# Patient Record
Sex: Female | Born: 1945 | Race: White | Hispanic: No | Marital: Single | Smoking: Never smoker
Health system: Southern US, Community
[De-identification: ages and names within clinical notes are randomized; demographics above are authoritative.]

## PROBLEM LIST (undated history)

## (undated) DIAGNOSIS — I10 Essential (primary) hypertension: Secondary | ICD-10-CM

## (undated) HISTORY — PX: ABDOMINAL HYSTERECTOMY: SHX81

---

## 2018-03-04 ENCOUNTER — Other Ambulatory Visit: Payer: Self-pay

## 2018-03-04 ENCOUNTER — Encounter: Payer: Self-pay | Admitting: Emergency Medicine

## 2018-03-04 ENCOUNTER — Emergency Department
Admission: EM | Admit: 2018-03-04 | Discharge: 2018-03-04 | Disposition: A | Discharge status: Home / Self Care | Payer: Medicare Other | Attending: Emergency Medicine | Admitting: Emergency Medicine

## 2018-03-04 ENCOUNTER — Emergency Department: Payer: Medicare Other

## 2018-03-04 DIAGNOSIS — Y999 Unspecified external cause status: Secondary | ICD-10-CM | POA: Insufficient documentation

## 2018-03-04 DIAGNOSIS — S8991XA Unspecified injury of right lower leg, initial encounter: Secondary | ICD-10-CM | POA: Insufficient documentation

## 2018-03-04 DIAGNOSIS — S6991XA Unspecified injury of right wrist, hand and finger(s), initial encounter: Secondary | ICD-10-CM | POA: Insufficient documentation

## 2018-03-04 DIAGNOSIS — Y9389 Activity, other specified: Secondary | ICD-10-CM | POA: Insufficient documentation

## 2018-03-04 DIAGNOSIS — R0789 Other chest pain: Secondary | ICD-10-CM | POA: Insufficient documentation

## 2018-03-04 DIAGNOSIS — Y92410 Unspecified street and highway as the place of occurrence of the external cause: Secondary | ICD-10-CM | POA: Insufficient documentation

## 2018-03-04 DIAGNOSIS — S8001XA Contusion of right knee, initial encounter: Secondary | ICD-10-CM | POA: Diagnosis not present

## 2018-03-04 DIAGNOSIS — I10 Essential (primary) hypertension: Secondary | ICD-10-CM | POA: Insufficient documentation

## 2018-03-04 DIAGNOSIS — S8992XA Unspecified injury of left lower leg, initial encounter: Secondary | ICD-10-CM | POA: Diagnosis not present

## 2018-03-04 DIAGNOSIS — S299XXA Unspecified injury of thorax, initial encounter: Secondary | ICD-10-CM | POA: Diagnosis present

## 2018-03-04 DIAGNOSIS — T148XXA Other injury of unspecified body region, initial encounter: Secondary | ICD-10-CM

## 2018-03-04 HISTORY — DX: Essential (primary) hypertension: I10

## 2018-03-04 MED ORDER — CYCLOBENZAPRINE HCL 10 MG PO TABS
10.0000 mg | ORAL_TABLET | Freq: Once | ORAL | Status: AC
  Administered 2018-03-04: 10 mg via ORAL

## 2018-03-04 MED ORDER — HYDROCODONE-ACETAMINOPHEN 5-325 MG PO TABS
1.0000 | ORAL_TABLET | Freq: Once | ORAL | Status: AC
  Administered 2018-03-04: 1 via ORAL
  Filled 2018-03-04: qty 1

## 2018-03-04 MED ORDER — HYDROCODONE-ACETAMINOPHEN 5-325 MG PO TABS: 1.0000 | ORAL_TABLET | Freq: Four times a day (QID) | ORAL | 0 refills | Status: AC | PRN

## 2018-03-04 MED ORDER — IBUPROFEN 100 MG/5ML PO SUSP
800.0000 mg | Freq: Once | ORAL | Status: DC
  Filled 2018-03-04: qty 40

## 2018-03-04 MED ORDER — IBUPROFEN 800 MG PO TABS: 800.0000 mg | ORAL_TABLET | Freq: Three times a day (TID) | ORAL | 0 refills | Status: AC | PRN

## 2018-03-04 MED ORDER — DOCUSATE SODIUM 100 MG PO CAPS
100.0000 mg | ORAL_CAPSULE | Freq: Once | ORAL | Status: AC
  Administered 2018-03-04: 100 mg via ORAL
  Filled 2018-03-04: qty 1

## 2018-03-04 MED ORDER — CYCLOBENZAPRINE HCL 10 MG PO TABS
10.0000 mg | ORAL_TABLET | Freq: Once | ORAL | Status: DC
  Filled 2018-03-04: qty 1

## 2018-03-04 MED ORDER — CYCLOBENZAPRINE HCL 10 MG PO TABS: 5.0000 mg | ORAL_TABLET | Freq: Three times a day (TID) | ORAL | 0 refills | Status: AC | PRN

## 2018-03-04 MED ORDER — CYCLOBENZAPRINE HCL 10 MG PO TABS
ORAL_TABLET | ORAL | Status: AC
  Filled 2018-03-04: qty 1

## 2018-03-04 MED ORDER — IBUPROFEN 800 MG PO TABS
800.0000 mg | ORAL_TABLET | Freq: Once | ORAL | Status: AC
  Administered 2018-03-04: 800 mg via ORAL
  Filled 2018-03-04: qty 1
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
## 2018-03-04 NOTE — ED Notes (Signed)
No peripheral IV placed this visit.   Discharge instructions reviewed with patient. Questions fielded by this RN. Patient verbalizes understanding of instructions. [place] in stable [place]. No acute distress noted at time of discharge.

## 2018-03-04 NOTE — Discharge Instructions (Addendum)
Your exam and x-rays are essentially normal at this time. You do not appear to have any acute fractures. You can expect some muscle soreness and stiffness over the next several days. Apply ice to the chest and knees. Take the prescription meds as directed. Follow-up with your provider for ongoing symptoms.
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
